# Patient Record
Sex: Female | Born: 2000 | Race: White | Hispanic: No | Marital: Single | Smoking: Never smoker
Health system: Southern US, Community
[De-identification: ages and names within clinical notes are randomized; demographics above are authoritative.]

## PROBLEM LIST (undated history)

## (undated) DIAGNOSIS — F329 Major depressive disorder, single episode, unspecified: Secondary | ICD-10-CM

## (undated) DIAGNOSIS — F32A Depression, unspecified: Secondary | ICD-10-CM

## (undated) DIAGNOSIS — F419 Anxiety disorder, unspecified: Secondary | ICD-10-CM

## (undated) DIAGNOSIS — H539 Unspecified visual disturbance: Secondary | ICD-10-CM

---
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
## 2018-03-24 DIAGNOSIS — L709 Acne, unspecified: Secondary | ICD-10-CM | POA: Diagnosis not present

## 2018-03-24 DIAGNOSIS — Z1389 Encounter for screening for other disorder: Secondary | ICD-10-CM | POA: Diagnosis not present

## 2018-03-24 DIAGNOSIS — Z0000 Encounter for general adult medical examination without abnormal findings: Secondary | ICD-10-CM | POA: Diagnosis not present
